# Patient Record
Sex: Female | Born: 1945
Health system: Southern US, Community
[De-identification: ages and names within clinical notes are randomized; demographics above are authoritative.]

---

## 2006-06-04 ENCOUNTER — Emergency Department: Payer: Self-pay | Admitting: Emergency Medicine

## 2006-06-04 ENCOUNTER — Other Ambulatory Visit: Payer: Self-pay

## 2006-06-20 ENCOUNTER — Ambulatory Visit: Payer: Self-pay | Admitting: Internal Medicine

## 2006-07-04 ENCOUNTER — Ambulatory Visit: Payer: Self-pay | Admitting: Internal Medicine

## 2006-08-25 ENCOUNTER — Ambulatory Visit: Payer: Self-pay | Admitting: Internal Medicine

## 2006-09-16 ENCOUNTER — Ambulatory Visit: Payer: Self-pay | Admitting: Psychiatry

## 2007-10-26 ENCOUNTER — Ambulatory Visit: Payer: Self-pay | Admitting: Internal Medicine

## 2008-10-27 ENCOUNTER — Ambulatory Visit: Payer: Self-pay | Admitting: Family Medicine

## 2011-01-25 ENCOUNTER — Ambulatory Visit: Payer: Self-pay | Admitting: Internal Medicine

## 2011-05-07 IMAGING — CR DG RIBS 2V*R*
1 series · 5 of 5 positions shown · non-contrast
Comparison: none

REASON FOR EXAM: painful
COMMENTS:

PROCEDURE:     MDR - MDR RIBS RIGHT UNLILATERAL  - January 25, 2011  [DATE]
RESULT:     No evidence of displaced rib fracture or pneumothorax.

[Series 1: view not recorded · 0.17mm/px · 5 of 5 slices shown]
[im 1/5]
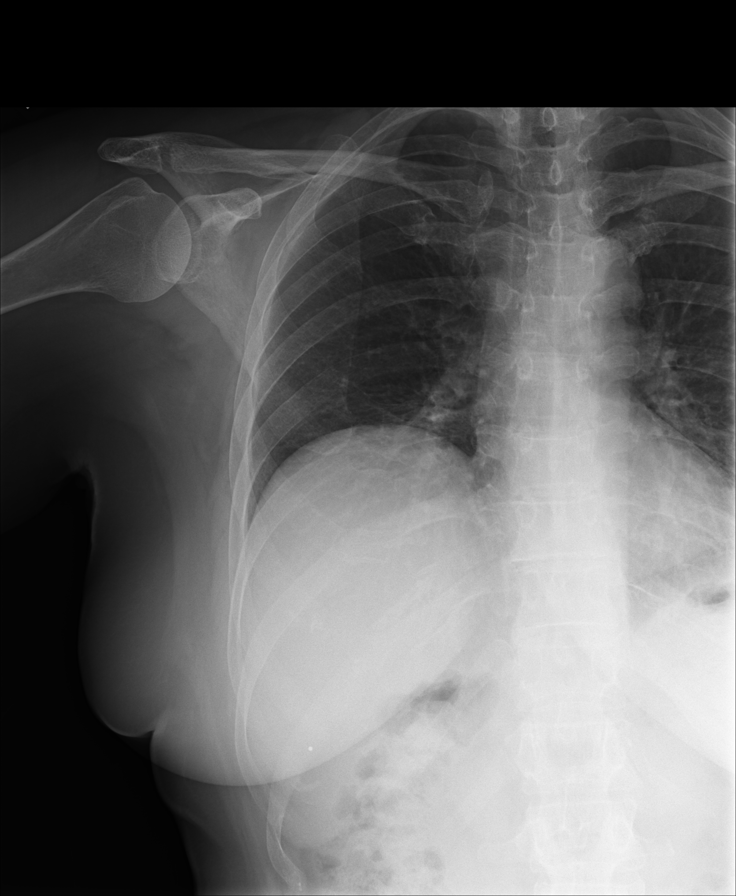
[im 2/5]
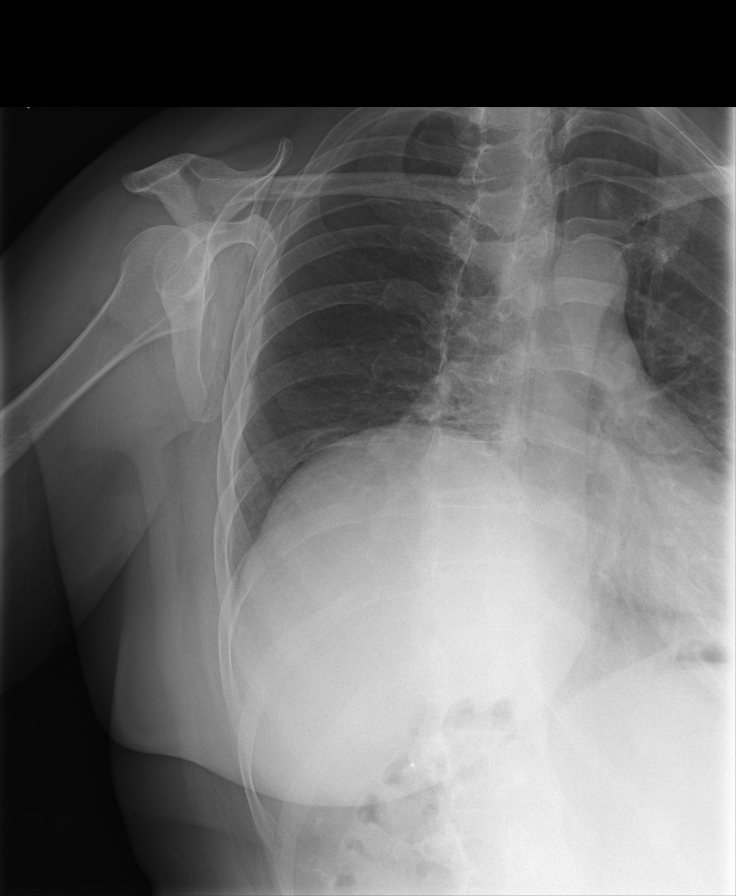
[im 3/5]
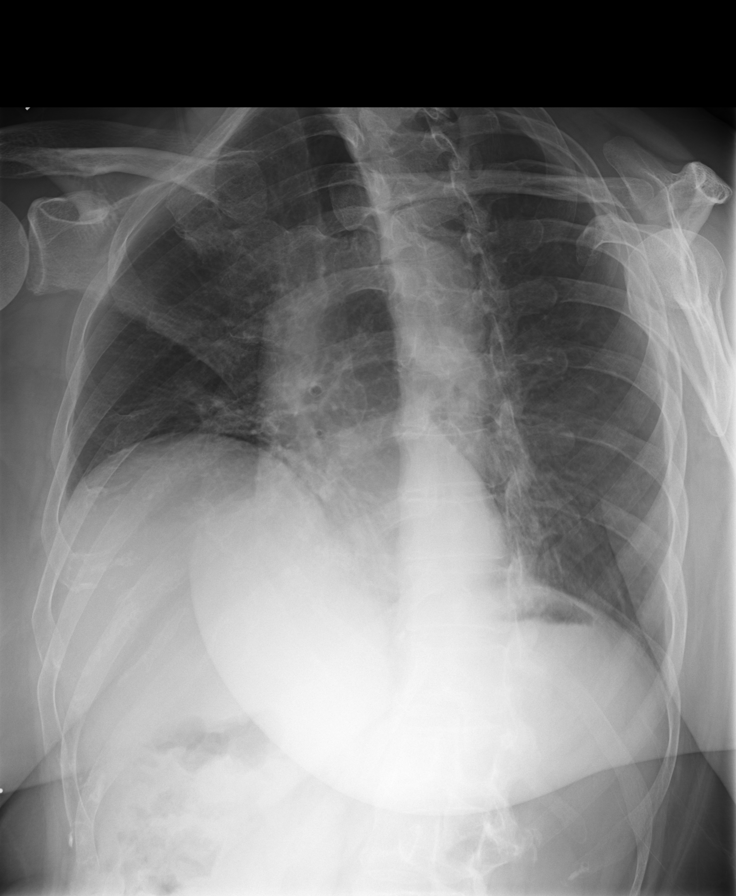
[im 4/5]
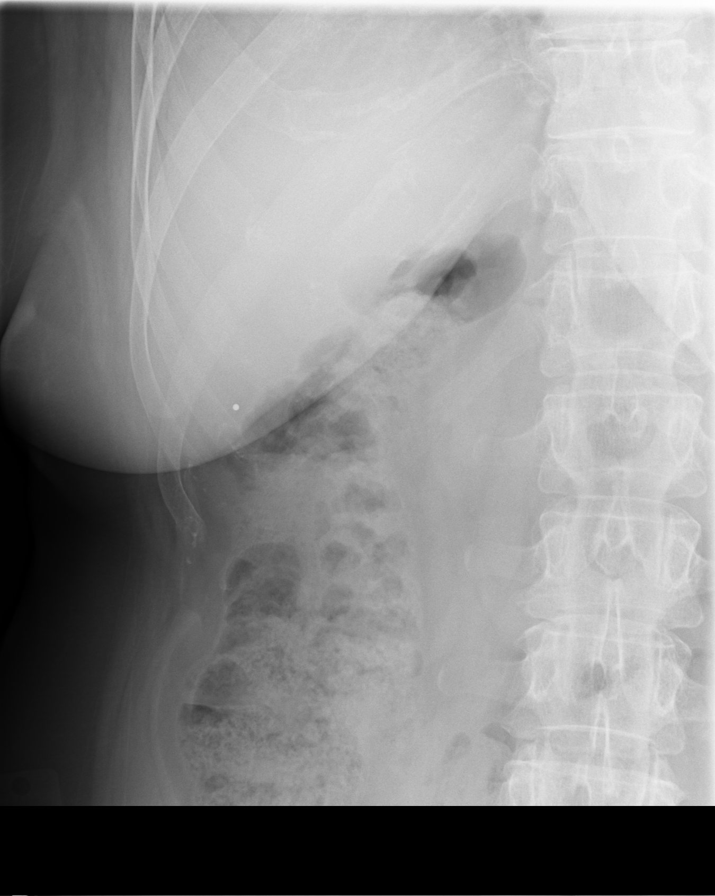
[im 5/5]
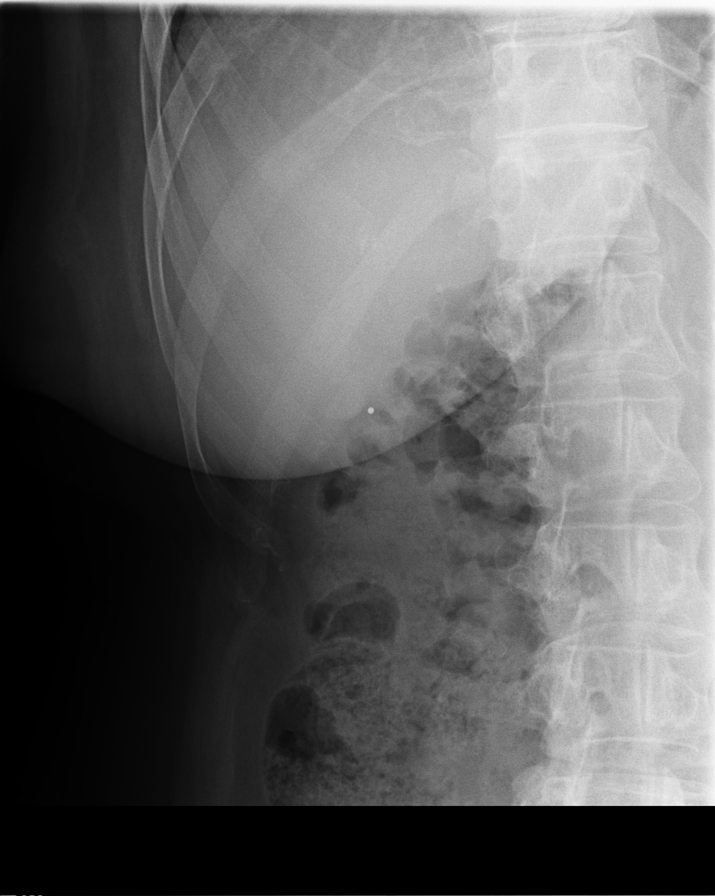

[5 of 5 positions shown; findings below may reference images not displayed]

IMPRESSION: No acute abnormality.

## 2011-12-04 ENCOUNTER — Ambulatory Visit: Payer: Self-pay | Admitting: Internal Medicine

## 2013-02-11 ENCOUNTER — Ambulatory Visit: Payer: Self-pay

## 2013-08-24 ENCOUNTER — Ambulatory Visit: Payer: Self-pay | Admitting: Internal Medicine

## 2015-10-24 DIAGNOSIS — R5381 Other malaise: Secondary | ICD-10-CM | POA: Diagnosis not present

## 2015-10-24 DIAGNOSIS — I10 Essential (primary) hypertension: Secondary | ICD-10-CM | POA: Diagnosis not present

## 2015-10-24 DIAGNOSIS — E784 Other hyperlipidemia: Secondary | ICD-10-CM | POA: Diagnosis not present

## 2015-10-24 DIAGNOSIS — Z0000 Encounter for general adult medical examination without abnormal findings: Secondary | ICD-10-CM | POA: Diagnosis not present

## 2016-03-11 DIAGNOSIS — M5430 Sciatica, unspecified side: Secondary | ICD-10-CM | POA: Diagnosis not present

## 2016-03-11 DIAGNOSIS — J0101 Acute recurrent maxillary sinusitis: Secondary | ICD-10-CM | POA: Diagnosis not present

## 2016-03-26 DIAGNOSIS — R062 Wheezing: Secondary | ICD-10-CM | POA: Diagnosis not present

## 2016-03-26 DIAGNOSIS — M5430 Sciatica, unspecified side: Secondary | ICD-10-CM | POA: Diagnosis not present

## 2016-03-26 DIAGNOSIS — J209 Acute bronchitis, unspecified: Secondary | ICD-10-CM | POA: Diagnosis not present

## 2016-03-26 DIAGNOSIS — J00 Acute nasopharyngitis [common cold]: Secondary | ICD-10-CM | POA: Diagnosis not present

## 2016-05-31 DIAGNOSIS — R062 Wheezing: Secondary | ICD-10-CM | POA: Diagnosis not present

## 2016-05-31 DIAGNOSIS — J209 Acute bronchitis, unspecified: Secondary | ICD-10-CM | POA: Diagnosis not present

## 2016-05-31 DIAGNOSIS — M5430 Sciatica, unspecified side: Secondary | ICD-10-CM | POA: Diagnosis not present

## 2016-05-31 DIAGNOSIS — J00 Acute nasopharyngitis [common cold]: Secondary | ICD-10-CM | POA: Diagnosis not present

## 2016-06-17 DIAGNOSIS — J209 Acute bronchitis, unspecified: Secondary | ICD-10-CM | POA: Diagnosis not present

## 2016-06-17 DIAGNOSIS — R062 Wheezing: Secondary | ICD-10-CM | POA: Diagnosis not present

## 2017-05-19 DIAGNOSIS — J00 Acute nasopharyngitis [common cold]: Secondary | ICD-10-CM | POA: Diagnosis not present

## 2017-05-19 DIAGNOSIS — J399 Disease of upper respiratory tract, unspecified: Secondary | ICD-10-CM | POA: Diagnosis not present

## 2017-05-19 DIAGNOSIS — J209 Acute bronchitis, unspecified: Secondary | ICD-10-CM | POA: Diagnosis not present

## 2017-05-19 DIAGNOSIS — M5430 Sciatica, unspecified side: Secondary | ICD-10-CM | POA: Diagnosis not present

## 2017-09-26 DIAGNOSIS — I10 Essential (primary) hypertension: Secondary | ICD-10-CM | POA: Diagnosis not present

## 2017-09-26 DIAGNOSIS — E7849 Other hyperlipidemia: Secondary | ICD-10-CM | POA: Diagnosis not present

## 2017-09-26 DIAGNOSIS — R5381 Other malaise: Secondary | ICD-10-CM | POA: Diagnosis not present

## 2018-02-03 DIAGNOSIS — J209 Acute bronchitis, unspecified: Secondary | ICD-10-CM | POA: Diagnosis not present

## 2018-02-03 DIAGNOSIS — J399 Disease of upper respiratory tract, unspecified: Secondary | ICD-10-CM | POA: Diagnosis not present

## 2018-02-03 DIAGNOSIS — J00 Acute nasopharyngitis [common cold]: Secondary | ICD-10-CM | POA: Diagnosis not present

## 2018-02-03 DIAGNOSIS — R062 Wheezing: Secondary | ICD-10-CM | POA: Diagnosis not present

## 2018-09-30 DIAGNOSIS — M5430 Sciatica, unspecified side: Secondary | ICD-10-CM | POA: Diagnosis not present

## 2018-09-30 DIAGNOSIS — Z0000 Encounter for general adult medical examination without abnormal findings: Secondary | ICD-10-CM | POA: Diagnosis not present

## 2018-10-01 DIAGNOSIS — E7849 Other hyperlipidemia: Secondary | ICD-10-CM | POA: Diagnosis not present

## 2018-10-01 DIAGNOSIS — R5381 Other malaise: Secondary | ICD-10-CM | POA: Diagnosis not present

## 2018-10-01 DIAGNOSIS — I10 Essential (primary) hypertension: Secondary | ICD-10-CM | POA: Diagnosis not present

## 2019-02-17 DIAGNOSIS — H2513 Age-related nuclear cataract, bilateral: Secondary | ICD-10-CM | POA: Diagnosis not present

## 2019-02-17 DIAGNOSIS — H52223 Regular astigmatism, bilateral: Secondary | ICD-10-CM | POA: Diagnosis not present

## 2020-10-02 ENCOUNTER — Encounter: Payer: Self-pay | Admitting: Internal Medicine

## 2020-10-02 ENCOUNTER — Other Ambulatory Visit: Payer: Self-pay

## 2020-10-02 ENCOUNTER — Ambulatory Visit (INDEPENDENT_AMBULATORY_CARE_PROVIDER_SITE_OTHER): Payer: Medicare Other | Admitting: Internal Medicine

## 2020-10-02 VITALS — BP 169/92 | HR 92 | Ht 67.0 in | Wt 166.5 lb

## 2020-10-02 DIAGNOSIS — I10 Essential (primary) hypertension: Secondary | ICD-10-CM | POA: Diagnosis not present

## 2020-10-02 DIAGNOSIS — Z0000 Encounter for general adult medical examination without abnormal findings: Secondary | ICD-10-CM

## 2020-10-02 DIAGNOSIS — Z23 Encounter for immunization: Secondary | ICD-10-CM | POA: Diagnosis not present

## 2020-10-02 NOTE — Progress Notes (Signed)
Established Patient Office Visit  Subjective:  Patient ID: Makayla Perez, female    DOB: January 29, 1946  Age: 74 y.o. MRN: [id number]  CC:  Chief Complaint  Patient presents with  . Annual Exam    HPI  Makayla Perez presents for physical  No past medical history on file.    No family history on file.  Social History   Socioeconomic History  . Marital status: Married    Spouse name: Not on file  . Number of children: Not on file  . Years of education: Not on file  . Highest education level: Not on file  Occupational History  . Not on file  Tobacco Use  . Smoking status: Never Smoker  . Smokeless tobacco: Never Used  Substance and Sexual Activity  . Alcohol use: Never  . Drug use: Not on file  . Sexual activity: Not on file  Other Topics Concern  . Not on file  Social History Narrative  . Not on file   Social Determinants of Health   Financial Resource Strain:   . Difficulty of Paying Living Expenses: Not on file  Food Insecurity:   . Worried About Charity fundraiser in the Last Year: Not on file  . Ran Out of Food in the Last Year: Not on file  Transportation Needs:   . Lack of Transportation (Medical): Not on file  . Lack of Transportation (Non-Medical): Not on file  Physical Activity:   . Days of Exercise per Week: Not on file  . Minutes of Exercise per Session: Not on file  Stress:   . Feeling of Stress : Not on file  Social Connections:   . Frequency of Communication with Friends and Family: Not on file  . Frequency of Social Gatherings with Friends and Family: Not on file  . Attends Religious Services: Not on file  . Active Member of Clubs or Organizations: Not on file  . Attends Archivist Meetings: Not on file  . Marital Status: Not on file  Intimate Partner Violence:   . Fear of Current or Ex-Partner: Not on file  . Emotionally Abused: Not on file  . Physically Abused: Not on file  . Sexually Abused: Not on file    No current  outpatient medications on file.   No Known Allergies  ROS Review of Systems  Constitutional: Negative.   HENT: Negative.   Eyes: Negative.   Respiratory: Negative.  Negative for chest tightness and shortness of breath.   Cardiovascular: Negative.  Negative for chest pain and leg swelling.  Gastrointestinal: Negative.  Negative for blood in stool, diarrhea and nausea.  Endocrine: Negative.  Negative for polydipsia and polyphagia.  Genitourinary: Negative.  Negative for flank pain.  Musculoskeletal: Positive for back pain. Negative for arthralgias, gait problem and neck pain.  Skin: Negative.   Allergic/Immunologic: Negative.   Neurological: Negative.  Negative for speech difficulty and headaches.  Hematological: Negative.   Psychiatric/Behavioral: Positive for confusion. The patient is not hyperactive.   All other systems reviewed and are negative.     Objective:    Physical Exam Vitals reviewed.  Constitutional:      Appearance: Normal appearance.  HENT:     Mouth/Throat:     Mouth: Mucous membranes are moist.  Eyes:     Pupils: Pupils are equal, round, and reactive to light.  Neck:     Vascular: No carotid bruit.  Cardiovascular:     Rate and Rhythm: Normal rate  and regular rhythm.     Pulses: Normal pulses.     Heart sounds: Normal heart sounds.  Pulmonary:     Effort: Pulmonary effort is normal.     Breath sounds: Normal breath sounds.  Abdominal:     General: Bowel sounds are normal.     Palpations: Abdomen is soft. There is no hepatomegaly, splenomegaly or mass.     Tenderness: There is no abdominal tenderness.     Hernia: No hernia is present.  Musculoskeletal:        General: No tenderness.     Cervical back: Neck supple.     Right lower leg: No edema.     Left lower leg: No edema.  Skin:    Findings: No rash.  Neurological:     Mental Status: She is alert and oriented to person, place, and time.     Motor: No weakness.  Psychiatric:        Mood and  Affect: Mood and affect normal.        Behavior: Behavior normal.     BP (!) 169/92   Pulse 92   Ht 5' 7"  (1.702 m)   Wt 166 lb 8 oz (75.5 kg)   BMI 26.08 kg/m  Wt Readings from Last 3 Encounters:  10/02/20 166 lb 8 oz (75.5 kg)     Health Maintenance Due  Topic Date Due  . Hepatitis C Screening  Never done  . COVID-19 Vaccine (1) Never done  . TETANUS/TDAP  Never done  . MAMMOGRAM  Never done  . COLONOSCOPY  Never done  . DEXA SCAN  Never done  . PNA vac Low Risk Adult (1 of 2 - PCV13) Never done    There are no preventive care reminders to display for this patient.  No results found for: TSH No results found for: WBC, HGB, HCT, MCV, PLT No results found for: NA, K, CHLORIDE, CO2, GLUCOSE, BUN, CREATININE, BILITOT, ALKPHOS, AST, ALT, PROT, ALBUMIN, CALCIUM, ANIONGAP, EGFR, GFR No results found for: CHOL No results found for: HDL No results found for: LDLCALC No results found for: TRIG No results found for: CHOLHDL No results found for: HGBA1C    Assessment & Plan:   Problem List Items Addressed This Visit    None    Visit Diagnoses    Need for influenza vaccination    -  Primary   Relevant Orders   Flu Vaccine QUAD 36+ mos IM (Completed)      No orders of the defined types were placed in this encounter.   Follow-up: No follow-ups on file.    Cletis Athens, MD

## 2020-10-02 NOTE — Assessment & Plan Note (Signed)
Patient physical examination is normal she does not want to recheck her breast.  She also does not want a Covid shot.  I had a detailed discussion with her about the Covid shot efficacy but she declined it.  She however was able to take the flu shot.  She does not have a mammogram or colonoscopy.  I asked her to see an eye doctor.  We will draw the blood test for CBC metabolic panel and TSH.  As well as a lipid panel.

## 2020-10-02 NOTE — Addendum Note (Signed)
Addended by: Jobie Quaker on: 10/02/2020 04:22 PM   Modules accepted: Orders

## 2020-10-02 NOTE — Assessment & Plan Note (Signed)
Patient does not want any medication for the blood pressure she was advised to follow low-salt diet and walk on a daily basis.

## 2020-10-02 NOTE — Assessment & Plan Note (Signed)
Patient was administered flu shot she does not want Covid shot at the present time

## 2020-10-03 LAB — COMPLETE METABOLIC PANEL WITH GFR
AG Ratio: 1.6 (calc) (ref 1.0–2.5)
ALT: 30 U/L — ABNORMAL HIGH (ref 6–29)
AST: 23 U/L (ref 10–35)
Albumin: 4.5 g/dL (ref 3.6–5.1)
Alkaline phosphatase (APISO): 65 U/L (ref 37–153)
BUN: 11 mg/dL (ref 7–25)
CO2: 26 mmol/L (ref 20–32)
Calcium: 9.9 mg/dL (ref 8.6–10.4)
Chloride: 105 mmol/L (ref 98–110)
Creat: 0.71 mg/dL (ref 0.60–0.93)
GFR, Est African American: 99 mL/min/{1.73_m2} (ref >=60)
GFR, Est Non African American: 85 mL/min/{1.73_m2} (ref >=60)
Globulin: 2.8 g/dL (calc) (ref 1.9–3.7)
Glucose, Bld: 87 mg/dL (ref 65–99)
Potassium: 4.5 mmol/L (ref 3.5–5.3)
Sodium: 139 mmol/L (ref 135–146)
Total Bilirubin: 0.4 mg/dL (ref 0.2–1.2)
Total Protein: 7.3 g/dL (ref 6.1–8.1)

## 2020-10-03 LAB — CBC WITH DIFFERENTIAL/PLATELET
Absolute Monocytes: 603 cells/uL (ref 200–950)
Basophils Absolute: 60 cells/uL (ref 0–200)
Basophils Relative: 0.9 %
Eosinophils Absolute: 101 cells/uL (ref 15–500)
Eosinophils Relative: 1.5 %
HCT: 44.5 % (ref 35.0–45.0)
Hemoglobin: 14.4 g/dL (ref 11.7–15.5)
Lymphs Abs: 1782 cells/uL (ref 850–3900)
MCH: 27.5 pg (ref 27.0–33.0)
MCHC: 32.4 g/dL (ref 32.0–36.0)
MCV: 85.1 fL (ref 80.0–100.0)
MPV: 10.4 fL (ref 7.5–12.5)
Monocytes Relative: 9 %
Neutro Abs: 4154 cells/uL (ref 1500–7800)
Neutrophils Relative %: 62 %
Platelets: 295 10*3/uL (ref 140–400)
RBC: 5.23 10*6/uL — ABNORMAL HIGH (ref 3.80–5.10)
RDW: 12.8 % (ref 11.0–15.0)
Total Lymphocyte: 26.6 %
WBC: 6.7 10*3/uL (ref 3.8–10.8)

## 2020-10-03 LAB — LIPID PANEL
Cholesterol: 222 mg/dL — ABNORMAL HIGH (ref <200)
HDL: 61 mg/dL (ref >=50)
LDL Cholesterol (Calc): 125 mg/dL (calc) — ABNORMAL HIGH
Non-HDL Cholesterol (Calc): 161 mg/dL (calc) — ABNORMAL HIGH (ref <130)
Total CHOL/HDL Ratio: 3.6 (calc) (ref <5.0)
Triglycerides: 216 mg/dL — ABNORMAL HIGH (ref <150)

## 2020-10-03 LAB — TSH: TSH: 1.53 mIU/L (ref 0.40–4.50)
